# Patient Record
Sex: Female | Born: 1935 | Race: White | Hispanic: No | Marital: Married | Smoking: Never smoker
Health system: Southern US, Community
[De-identification: ages and names within clinical notes are randomized; demographics above are authoritative.]

## PROBLEM LIST (undated history)

## (undated) DIAGNOSIS — C801 Malignant (primary) neoplasm, unspecified: Secondary | ICD-10-CM

## (undated) DIAGNOSIS — I10 Essential (primary) hypertension: Secondary | ICD-10-CM

## (undated) HISTORY — PX: ABDOMINAL HYSTERECTOMY: SHX81

---

## 2019-12-10 ENCOUNTER — Encounter (HOSPITAL_BASED_OUTPATIENT_CLINIC_OR_DEPARTMENT_OTHER): Payer: Self-pay

## 2019-12-10 ENCOUNTER — Other Ambulatory Visit: Payer: Self-pay

## 2019-12-10 ENCOUNTER — Emergency Department (HOSPITAL_BASED_OUTPATIENT_CLINIC_OR_DEPARTMENT_OTHER): Payer: Medicare Other

## 2019-12-10 ENCOUNTER — Emergency Department (HOSPITAL_BASED_OUTPATIENT_CLINIC_OR_DEPARTMENT_OTHER)
Admission: EM | Admit: 2019-12-10 | Discharge: 2019-12-10 | Disposition: A | Discharge status: Home / Self Care | Payer: Medicare Other | Attending: Emergency Medicine | Admitting: Emergency Medicine

## 2019-12-10 DIAGNOSIS — S5011XA Contusion of right forearm, initial encounter: Secondary | ICD-10-CM | POA: Insufficient documentation

## 2019-12-10 DIAGNOSIS — E871 Hypo-osmolality and hyponatremia: Secondary | ICD-10-CM | POA: Diagnosis not present

## 2019-12-10 DIAGNOSIS — C7951 Secondary malignant neoplasm of bone: Secondary | ICD-10-CM

## 2019-12-10 DIAGNOSIS — D72829 Elevated white blood cell count, unspecified: Secondary | ICD-10-CM | POA: Diagnosis not present

## 2019-12-10 DIAGNOSIS — Z79899 Other long term (current) drug therapy: Secondary | ICD-10-CM | POA: Diagnosis not present

## 2019-12-10 DIAGNOSIS — J45909 Unspecified asthma, uncomplicated: Secondary | ICD-10-CM | POA: Insufficient documentation

## 2019-12-10 DIAGNOSIS — S59911A Unspecified injury of right forearm, initial encounter: Secondary | ICD-10-CM | POA: Diagnosis present

## 2019-12-10 DIAGNOSIS — Y929 Unspecified place or not applicable: Secondary | ICD-10-CM | POA: Insufficient documentation

## 2019-12-10 DIAGNOSIS — W19XXXA Unspecified fall, initial encounter: Secondary | ICD-10-CM | POA: Diagnosis not present

## 2019-12-10 DIAGNOSIS — Z7982 Long term (current) use of aspirin: Secondary | ICD-10-CM | POA: Insufficient documentation

## 2019-12-10 DIAGNOSIS — Y939 Activity, unspecified: Secondary | ICD-10-CM | POA: Insufficient documentation

## 2019-12-10 DIAGNOSIS — I10 Essential (primary) hypertension: Secondary | ICD-10-CM | POA: Insufficient documentation

## 2019-12-10 DIAGNOSIS — C801 Malignant (primary) neoplasm, unspecified: Secondary | ICD-10-CM | POA: Diagnosis not present

## 2019-12-10 DIAGNOSIS — Y999 Unspecified external cause status: Secondary | ICD-10-CM | POA: Diagnosis not present

## 2019-12-10 DIAGNOSIS — T07XXXA Unspecified multiple injuries, initial encounter: Secondary | ICD-10-CM

## 2019-12-10 DIAGNOSIS — D649 Anemia, unspecified: Secondary | ICD-10-CM

## 2019-12-10 HISTORY — DX: Malignant (primary) neoplasm, unspecified: C80.1

## 2019-12-10 HISTORY — DX: Essential (primary) hypertension: I10

## 2019-12-10 LAB — CBC WITH DIFFERENTIAL/PLATELET
Abs Immature Granulocytes: 0.08 10*3/uL — ABNORMAL HIGH (ref 0.00–0.07)
Basophils Absolute: 0.1 10*3/uL (ref 0.0–0.1)
Basophils Relative: 0 %
Eosinophils Absolute: 0.2 10*3/uL (ref 0.0–0.5)
Eosinophils Relative: 1 %
HCT: 26.7 % — ABNORMAL LOW (ref 36.0–46.0)
Hemoglobin: 8.6 g/dL — ABNORMAL LOW (ref 12.0–15.0)
Immature Granulocytes: 1 %
Lymphocytes Relative: 7 %
Lymphs Abs: 1 10*3/uL (ref 0.7–4.0)
MCH: 29.1 pg (ref 26.0–34.0)
MCHC: 32.2 g/dL (ref 30.0–36.0)
MCV: 90.2 fL (ref 80.0–100.0)
Monocytes Absolute: 1.2 10*3/uL — ABNORMAL HIGH (ref 0.1–1.0)
Monocytes Relative: 8 %
Neutro Abs: 13.1 10*3/uL — ABNORMAL HIGH (ref 1.7–7.7)
Neutrophils Relative %: 83 %
Platelets: 364 10*3/uL (ref 150–400)
RBC: 2.96 MIL/uL — ABNORMAL LOW (ref 3.87–5.11)
RDW: 14.9 % (ref 11.5–15.5)
WBC: 15.6 10*3/uL — ABNORMAL HIGH (ref 4.0–10.5)
nRBC: 0 % (ref 0.0–0.2)

## 2019-12-10 LAB — COMPREHENSIVE METABOLIC PANEL
ALT: 14 U/L (ref 0–44)
AST: 19 U/L (ref 15–41)
Albumin: 3.3 g/dL — ABNORMAL LOW (ref 3.5–5.0)
Alkaline Phosphatase: 154 U/L — ABNORMAL HIGH (ref 38–126)
Anion gap: 8 (ref 5–15)
BUN: 24 mg/dL — ABNORMAL HIGH (ref 8–23)
CO2: 22 mmol/L (ref 22–32)
Calcium: 9.8 mg/dL (ref 8.9–10.3)
Chloride: 94 mmol/L — ABNORMAL LOW (ref 98–111)
Creatinine, Ser: 0.8 mg/dL (ref 0.44–1.00)
GFR calc Af Amer: >60 mL/min (ref >60)
GFR calc non Af Amer: >60 mL/min (ref >60)
Glucose, Bld: 115 mg/dL — ABNORMAL HIGH (ref 70–99)
Potassium: 4.5 mmol/L (ref 3.5–5.1)
Sodium: 124 mmol/L — ABNORMAL LOW (ref 135–145)
Total Bilirubin: 0.8 mg/dL (ref 0.3–1.2)
Total Protein: 6.9 g/dL (ref 6.5–8.1)

## 2019-12-10 LAB — CBG MONITORING, ED: Glucose-Capillary: 96 mg/dL (ref 70–99)

## 2019-12-10 MED ORDER — SODIUM CHLORIDE 0.9 % IV BOLUS
500.0000 mL | Freq: Once | INTRAVENOUS | Status: AC
  Administered 2019-12-10: 500 mL via INTRAVENOUS

## 2019-12-10 NOTE — ED Notes (Signed)
[facility] contacted for ride home, pt's daughter states she cannot get her into her home.

## 2019-12-10 NOTE — ED Notes (Signed)
Daughter called stated she's in the parking lot if needed to call her @ [phone]

## 2019-12-10 NOTE — ED Triage Notes (Signed)
Pt arrived via [facility]. They report a home health nurse wanted pt taken to the hospital to have her R arm evaluated for swelling and bruising. Pt has a large hematoma to R mid-forearm with bruising that extends to finger. Pt has a hx of a fall recently, but pt is a poor historian as to when it happened. Pt also noted to have multiple other areas of bruising. Pt is A/Ox4 in triage.

## 2019-12-10 NOTE — ED Notes (Signed)
Patient transported to X-ray 

## 2019-12-10 NOTE — ED Notes (Signed)
Pt transferred home with [facility]. Discharge paperwork sent with patient.

## 2019-12-10 NOTE — ED Notes (Addendum)
Return address verified with daughter. Both RN and EDP spoke with daughter regarding home instructions, paperwork to be sent with [facility].

## 2019-12-10 NOTE — ED Provider Notes (Signed)
[facility] EMERGENCY DEPARTMENT Provider Note   CSN: [id number] Arrival date & time: 12/10/19  1639     History Chief Complaint  Patient presents with  . Arm Injury    Brandi Best is a 86 y.o. female.  Pt presents to the ED today with frequent falls.  Pt is a poor historian and is unable to tell me what happened.  Pt's home health nurse called EMS.  Pt has bruises everywhere and a deformity to the right forearm.  Pt denies any pain.  Pt was admitted from 12/18-12/26 at [facility].  She had pulmonary edema, bladder cancer, and low sodium.  She had leukocytosis and anemia.  SNF was recommended, but family wanted to take her home with home health.           Past Medical History:  Diagnosis Date  . Cancer ([place])    bladder (resolved)  . Hypertension     Past Medical History:  Diagnosis Date  . Acute cystitis with hematuria 09/29/2017  . Asthma  no problems in years  . Basal cell carcinoma, face  nose, forehead  . Bladder carcinoma ([place]) 02/17/2019  . Bladder tumor 01/21/2019  . History of kidney stones  states she passed the stones  . Hypertension with goal to be determined  . Kidney stone  . Osteoporosis  . Overactive thyroid gland  . Post-menopausal atrophic vaginitis 11/08/2018   Patient Active Problem List  Diagnosis Date Noted  . Leukocytosis 11/26/2019  . Debility 11/26/2019  . Hyponatremia 11/26/2019  . SIADH (syndrome of inappropriate ADH production) ([place]) 11/26/2019  . Oropharyngeal dysphagia 11/26/2019  . Malignant neoplasm of bladder ([place]) 11/20/2019  . History of bladder cancer 10/21/2019  . Anemia 10/21/2019  . Dysphonia 08/19/2019  . Bladder carcinoma ([place]) 02/17/2019  . Post-menopausal atrophic vaginitis 11/08/2018  . Acute cystitis with hematuria 09/29/2017  . Hematuria 09/24/2017  . Urethral caruncle 09/24/2017  . Raynaud's phenomenon without gangrene 06/12/2017  . Hyperthyroidism 01/27/2017  . Osteoporosis 12/22/2015  .  Benign essential hypertension 12/22/2015  . Dysuria 12/22/2015  . Hyperlipidemia LDL goal <100 12/22/2015  . Nontoxic multinodular goiter 12/22/2015  . Pain, joint, multiple sites 12/22/2015  . Impaired fasting glucose 12/22/2015   There are no problems to display for this patient.   Past Surgical History:  Procedure Laterality Date  . ABDOMINAL HYSTERECTOMY       OB History   No obstetric history on file.     No family history on file.  Social History   Tobacco Use  . Smoking status: Never Smoker  . Smokeless tobacco: Never Used  Substance Use Topics  . Alcohol use: Never  . Drug use: Never    Home Medications Prior to Admission medications   Medication Sig Start Date End Date Taking? Authorizing Provider  metoprolol tartrate (LOPRESSOR) 100 MG tablet Take 100 mg by mouth once. 07/02/15 12/26/19 Yes [provider]  amLODipine (NORVASC) 10 MG tablet Take 10 mg by mouth daily. 11/26/19   [provider]  START taking these medications  Details  amLODIPine (NORVASC) 10 MG tablet Take 1 tablet (10 mg total) by mouth daily for 30 days. Qty: 30 tablet, Refills: 0   azithromycin (ZITHROMAX) 500 MG tablet Take 1 tablet (500 mg total) by mouth daily for 5 days. Qty: 5 tablet, Refills: 0  Associated Diagnoses: Cardiogenic pulmonary edema ([place]); Leukocytosis, unspecified type   dextromethorphan-guaifenesin (ROBITUSSIN-DM) 10-100 mg/5 mL syrup Take 5 mLs by mouth every 6 (six) hours  as needed for up to 10 days. Qty: 118 mL, Refills: 0    CONTINUE these medications which have CHANGED  Details  !! metoPROLOL tartrate (LOPRESSOR) 100 MG tablet Take 1 tablet (100 mg total) by mouth 2 times daily for 30 days. Qty: 60 tablet, Refills: 0   !! - Potential duplicate medications found. Please discuss with provider.   CONTINUE these medications which have NOT CHANGED  Details  acetaminophen (TYLENOL) 325 MG tablet Take 325 mg by mouth every 6 (six) hours as needed  for Pain.   ascorbic acid, vitamin C, (VITAMIN C) 500 MG tablet Take 1 tablet (500 mg total) by mouth daily. Qty:  Associated Diagnoses: Anemia, unspecified type   aspirin 81 MG chewable tablet *ANTIPLATELET* Take 81 mg by mouth daily. On hold pre-op   calcium-vitamin D (CALCIUM-VITAMIN D) 500 mg(1,267m) -200 unit per tablet Take 1 tablet by mouth daily. Use as directed   estradiol (ESTRACE) 0.01 % (0.1 mg/gram) vaginal cream apply 2 times/week intravaginally Qty: 42.5 g, Refills: 1   ferrous sulfate 325 (65 FE) MG EC tablet Take 1 tablet (325 mg total) by mouth daily with breakfast.  Associated Diagnoses: Anemia, unspecified type   losartan (COZAAR) 100 MG tablet TAKE 1 TABLET DAILY Qty: 90 tablet, Refills: 4   !! metoPROLOL tartrate (LOPRESSOR) 100 MG tablet Take 50 mg by mouth every morning.   multivitamin (THERAGRAN) tablet Take 1 tablet by mouth daily.   oxybutynin XL (DITROPAN XL) 5 MG extended release tablet Take 1 tablet (5 mg total) by mouth daily. Qty: 90 tablet, Refills: 3   sertraline (ZOLOFT) 25 MG tablet Take 1 tablet (25 mg total) by mouth daily. Qty: 30 tablet, Refills: 5  Associated Diagnoses: Current mild episode of major depressive disorder without prior episode ([place]   !! - Potential duplicate medications found. Please discuss with provider.   STOP taking these medications   ciprofloxacin HCl (CIPRO) 250 MG tablet   tiZANidine (ZANAFLEX) 4 MG tablet     Allergies    Sulfa antibiotics, Cardizem  [diltiazem], Lisinopril, Morphine and related, Other, Sulfamethoxazole-trimethoprim, Ciprofloxacin, and Nitrofurantoin  Review of Systems   Review of Systems  All other systems reviewed and are negative.   Physical Exam Updated Vital Signs BP (!) 142/53 (BP Location: Left Arm)   Pulse 82   Temp 98 F (36.7 C) (Oral)   Resp (!) 22   Ht _0  (1.575 m)   Wt 43.5 kg   SpO2 98%   BMI 17.56 kg/m   Physical Exam Vitals and nursing note reviewed.    Constitutional:      Appearance: Normal appearance.  HENT:     Head: Normocephalic and atraumatic.     Right Ear: External ear normal.     Left Ear: External ear normal.     Nose: Nose normal.     Mouth/Throat:     Mouth: Mucous membranes are moist.     Pharynx: Oropharynx is clear.  Eyes:     Extraocular Movements: Extraocular movements intact.     Conjunctiva/sclera: Conjunctivae normal.     Pupils: Pupils are equal, round, and reactive to light.  Cardiovascular:     Rate and Rhythm: Normal rate and regular rhythm.     Pulses: Normal pulses.     Heart sounds: Normal heart sounds.  Pulmonary:     Effort: Pulmonary effort is normal.     Breath sounds: Normal breath sounds.  Abdominal:     General: Abdomen is flat.  Bowel sounds are normal.     Palpations: Abdomen is soft.  Musculoskeletal:     Right forearm: Deformity present.     Cervical back: Normal range of motion and neck supple.     Right foot: Deformity and tenderness present.  Skin:    General: Skin is warm.     Capillary Refill: Capillary refill takes less than 2 seconds.  Neurological:     General: No focal deficit present.     Mental Status: She is alert and oriented to person, place, and time.  Psychiatric:        Mood and Affect: Mood normal.        Behavior: Behavior normal.        Thought Content: Thought content normal.        Judgment: Judgment normal.     ED Results / Procedures / Treatments   Labs (all labs ordered are listed, but only abnormal results are displayed) Labs Reviewed  CBC WITH DIFFERENTIAL/PLATELET - Abnormal; Notable for the following components:      Result Value   WBC 15.6 (*)    RBC 2.96 (*)    Hemoglobin 8.6 (*)    HCT 26.7 (*)    Neutro Abs 13.1 (*)    Monocytes Absolute 1.2 (*)    Abs Immature Granulocytes 0.08 (*)    All other components within normal limits  COMPREHENSIVE METABOLIC PANEL - Abnormal; Notable for the following components:   Sodium 124 (*)    Chloride  94 (*)    Glucose, Bld 115 (*)    BUN 24 (*)    Albumin 3.3 (*)    Alkaline Phosphatase 154 (*)    All other components within normal limits  URINALYSIS, ROUTINE W REFLEX MICROSCOPIC  CBG MONITORING, ED    EKG EKG Interpretation  Date/Time:  Saturday December 10 2019 17:24:12 EST Ventricular Rate:  79 PR Interval:    QRS Duration: 89 QT Interval:  364 QTC Calculation: 418 R Axis:   47 Text Interpretation: Sinus rhythm LVH with secondary repolarization abnormality Baseline wander in lead(s) V3 No old tracing to compare Confirmed by Isla Pence [phone]) on 12/10/2019 5:36:20 PM   Radiology DG Chest 2 View  Result Date: 12/10/2019 CLINICAL DATA:  Pain status post fall EXAM: CHEST - 2 VIEW COMPARISON:  November 24, 2019 FINDINGS: There is a moderate-sized right-sided pleural effusion. There is a small left-sided pleural effusion. Aortic calcifications are noted. The heart size is mildly enlarged. There is no pneumothorax. Bibasilar atelectasis is noted. There is no obvious acute osseous abnormality. IMPRESSION: 1. Small to moderate-sized bilateral pleural effusions, right greater than left. 2. Bibasilar airspace opacities favored to represent atelectasis. 3. Aortic atherosclerosis.  Mildly enlarged cardiac silhouette. Electronically Signed   By: Constance Holster M.D.   On: 12/10/2019 18:23   DG Pelvis 1-2 Views  Result Date: 12/10/2019 CLINICAL DATA:  Pain status post fall EXAM: PELVIS - 1-2 VIEW COMPARISON:  None. FINDINGS: There is no evidence of pelvic fracture or diastasis. No pelvic bone lesions are seen. IMPRESSION: Negative. Electronically Signed   By: Constance Holster M.D.   On: 12/10/2019 18:27   DG Forearm Right  Result Date: 12/10/2019 CLINICAL DATA:  Pain status post fall EXAM: RIGHT FOREARM - 2 VIEW COMPARISON:  None. FINDINGS: There is soft tissue swelling about the radial aspect of the mid forearm. There is no underlying acute displaced fracture. There is no dislocation.  IMPRESSION: Soft tissue swelling about the radial aspect of  the mid forearm without underlying acute displaced fracture or dislocation. Electronically Signed   By: Constance Holster M.D.   On: 12/10/2019 18:25   DG Ankle Complete Right  Result Date: 12/10/2019 CLINICAL DATA:  Pain status post fall EXAM: RIGHT ANKLE - COMPLETE 3+ VIEW COMPARISON:  None. FINDINGS: There is no evidence of fracture, dislocation, or joint effusion. There is no evidence of arthropathy or other focal bone abnormality. Soft tissues are unremarkable. IMPRESSION: Negative. Electronically Signed   By: Constance Holster M.D.   On: 12/10/2019 18:26   CT Head Wo Contrast  Result Date: 12/10/2019 CLINICAL DATA:  Mental status changes EXAM: CT HEAD WITHOUT CONTRAST TECHNIQUE: Contiguous axial images were obtained from the base of the skull through the vertex without intravenous contrast. COMPARISON:  08/28/2011 FINDINGS: Brain: There is atrophy and chronic small vessel disease changes. No acute intracranial abnormality. Specifically, no hemorrhage, hydrocephalus, mass lesion, acute infarction, or significant intracranial injury. Vascular: No hyperdense vessel or unexpected calcification. Skull: No acute calvarial abnormality. Sinuses/Orbits: Visualized paranasal sinuses and mastoids clear. Orbital soft tissues unremarkable. Other: None IMPRESSION: Atrophy, chronic microvascular disease. No acute intracranial abnormality. Electronically Signed   By: Rolm Baptise M.D.   On: 12/10/2019 18:23   CT Cervical Spine Wo Contrast  Result Date: 12/10/2019 CLINICAL DATA:  Recent fall.  Right arm bruising.  Neck trauma. EXAM: CT CERVICAL SPINE WITHOUT CONTRAST TECHNIQUE: Multidetector CT imaging of the cervical spine was performed without intravenous contrast. Multiplanar CT image reconstructions were also generated. COMPARISON:  Plain films 11/16/2019 FINDINGS: Alignment: No subluxation Skull base and vertebrae: There is a destructive lytic lesion  within the left side of the T1 vertebral body. Concern for associated mild pathologic compression fracture. Soft tissues and spinal canal: Abnormal soft tissue mass extends from the T1 vertebral body into the left neural foramina and into the spinal canal with compression on the left anterior thecal sac. Disc levels:  Disc space narrowing in the lower cervical spine. Upper chest: Biapical scarring. Other: No acute findings.  Carotid artery calcifications. IMPRESSION: Lytic expansile lesion in the left side of the T1 vertebral body with associated soft tissue extending into the spinal canal and left neural foramina. This is concerning for osseous metastasis. Recommend clinical correlation for cancer history. Concern for pathologic mild compression fracture through the T1 vertebral body. These results were called by telephone at the time of interpretation on 12/10/2019 at 6:31 pm to provider [facility] , who verbally acknowledged these results. Electronically Signed   By: Rolm Baptise M.D.   On: 12/10/2019 18:31   DG Hand Complete Left  Result Date: 12/10/2019 CLINICAL DATA:  Pain status post fall EXAM: LEFT HAND - COMPLETE 3+ VIEW COMPARISON:  None. FINDINGS: There is no evidence of fracture or dislocation. There is no evidence of arthropathy or other focal bone abnormality. Soft tissues are unremarkable. There is nonspecific flexion at the D IP of the second digit. IMPRESSION: Negative. Electronically Signed   By: Constance Holster M.D.   On: 12/10/2019 18:27   DG Foot Complete Right  Result Date: 12/10/2019 CLINICAL DATA:  Pain status post fall EXAM: RIGHT FOOT COMPLETE - 3+ VIEW COMPARISON:  None. FINDINGS: There is no evidence of fracture or dislocation. There is no evidence of arthropathy or other focal bone abnormality. Soft tissues are unremarkable. IMPRESSION: Negative. Electronically Signed   By: Constance Holster M.D.   On: 12/10/2019 18:22    Procedures Procedures (including critical care  time)  Medications Ordered in  ED Medications  sodium chloride 0.9 % bolus 500 mL (500 mLs Intravenous New Bag/Given 12/10/19 1746)    ED Course  I have reviewed the triage vital signs and the nursing notes.  Pertinent labs & imaging results that were available during my care of the patient were reviewed by me and considered in my medical decision making (see chart for details).    MDM Rules/Calculators/A&P                      Most recent labs: WBC 19.6 (H) 11/26/2019  WBC 19.7 (H) 11/26/2019  HGB 10.0 (L) 11/26/2019  HCT 29.7 (L) 11/26/2019  PLT 358 11/26/2019  CHOL 124 11/24/2019  TRIG 73 11/24/2019  HDL 42 11/24/2019  ALT 9 11/18/2019  AST 17 11/18/2019  NA 129 (L) 11/26/2019  K 4.4 11/26/2019  CL 95 (L) 11/26/2019  CREATININE 0.80 11/26/2019  BUN 30 (H) 11/26/2019  CO2 28 11/26/2019  TSH 1.550 11/21/2019  LABPROT 13.5 09/28/2017  PTT 31.5 09/28/2017  INR 1.03 09/28/2017   From PCP:  Discussed results of labs from 12/31-WBC 17.4 and Na+ 130.   Pt has a hx of hyponatremia.  She did receive Tolvaptan which did not help much, so her low sodium was treated with volume restriction as it is thought she has SIADH vs an underlying malignancy.  Pt d/w her daughter Si Raider.  She is her healthcare POA.  She said they have everything for pt at home. They have home health, OT/PT.  She has spoken with pt's cancer doctor who said pt is too weak to undergo any kind of bone marrow eval/treatment.  Pt's daughter told of the met to T1.  The daughter is in agreement for comfort care only.  She seems very reasonable.  Pt is not in pain.  She is stable for d/c home.  CRITICAL CARE Performed by: Isla Pence   Total critical care time: 30 minutes  Critical care time was exclusive of separately billable procedures and treating other patients.  Critical care was necessary to treat or prevent imminent or life-threatening deterioration.  Critical care was time spent personally  by me on the following activities: development of treatment plan with patient and/or surrogate as well as nursing, discussions with consultants, evaluation of patient's response to treatment, examination of patient, obtaining history from patient or surrogate, ordering and performing treatments and interventions, ordering and review of laboratory studies, ordering and review of radiographic studies, pulse oximetry and re-evaluation of patient's condition.    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

## 2020-01-30 DEATH — deceased

## 2021-01-30 IMAGING — DX DG CHEST 2V
2 series · 2 of 2 positions shown · non-contrast
Comparison: November 24, 2019

CLINICAL DATA: Pain status post fall

EXAM:
CHEST - 2 VIEW

[chest lat]
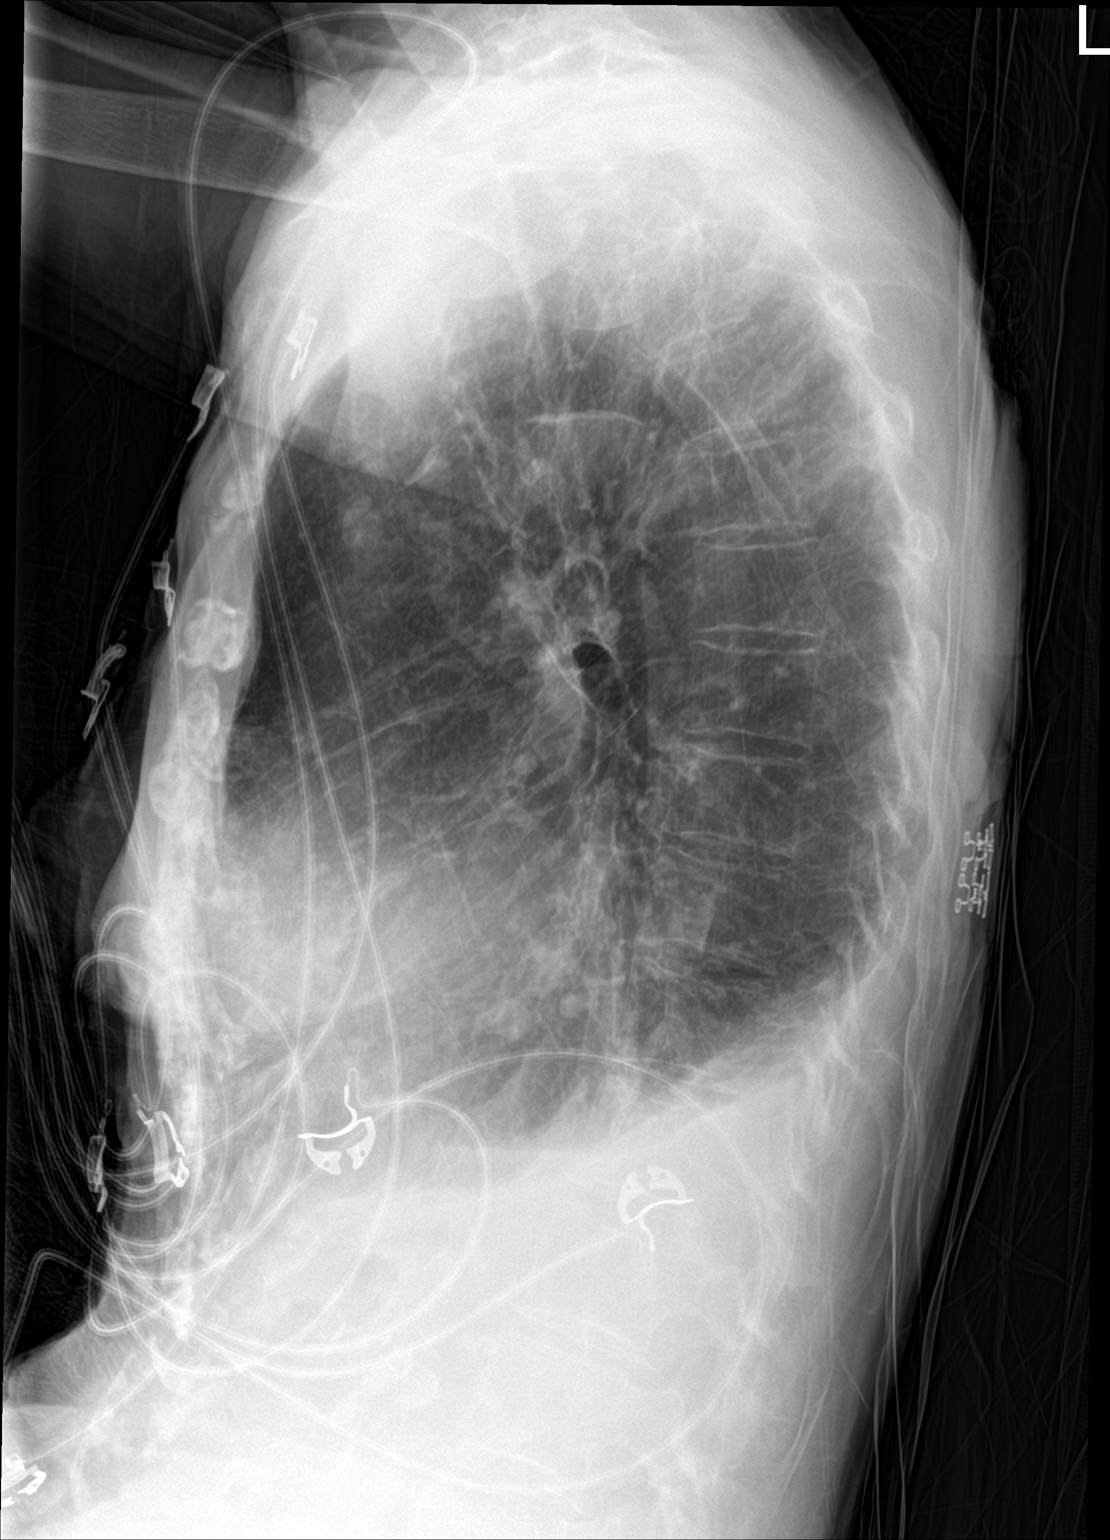

[chest ap strecther]
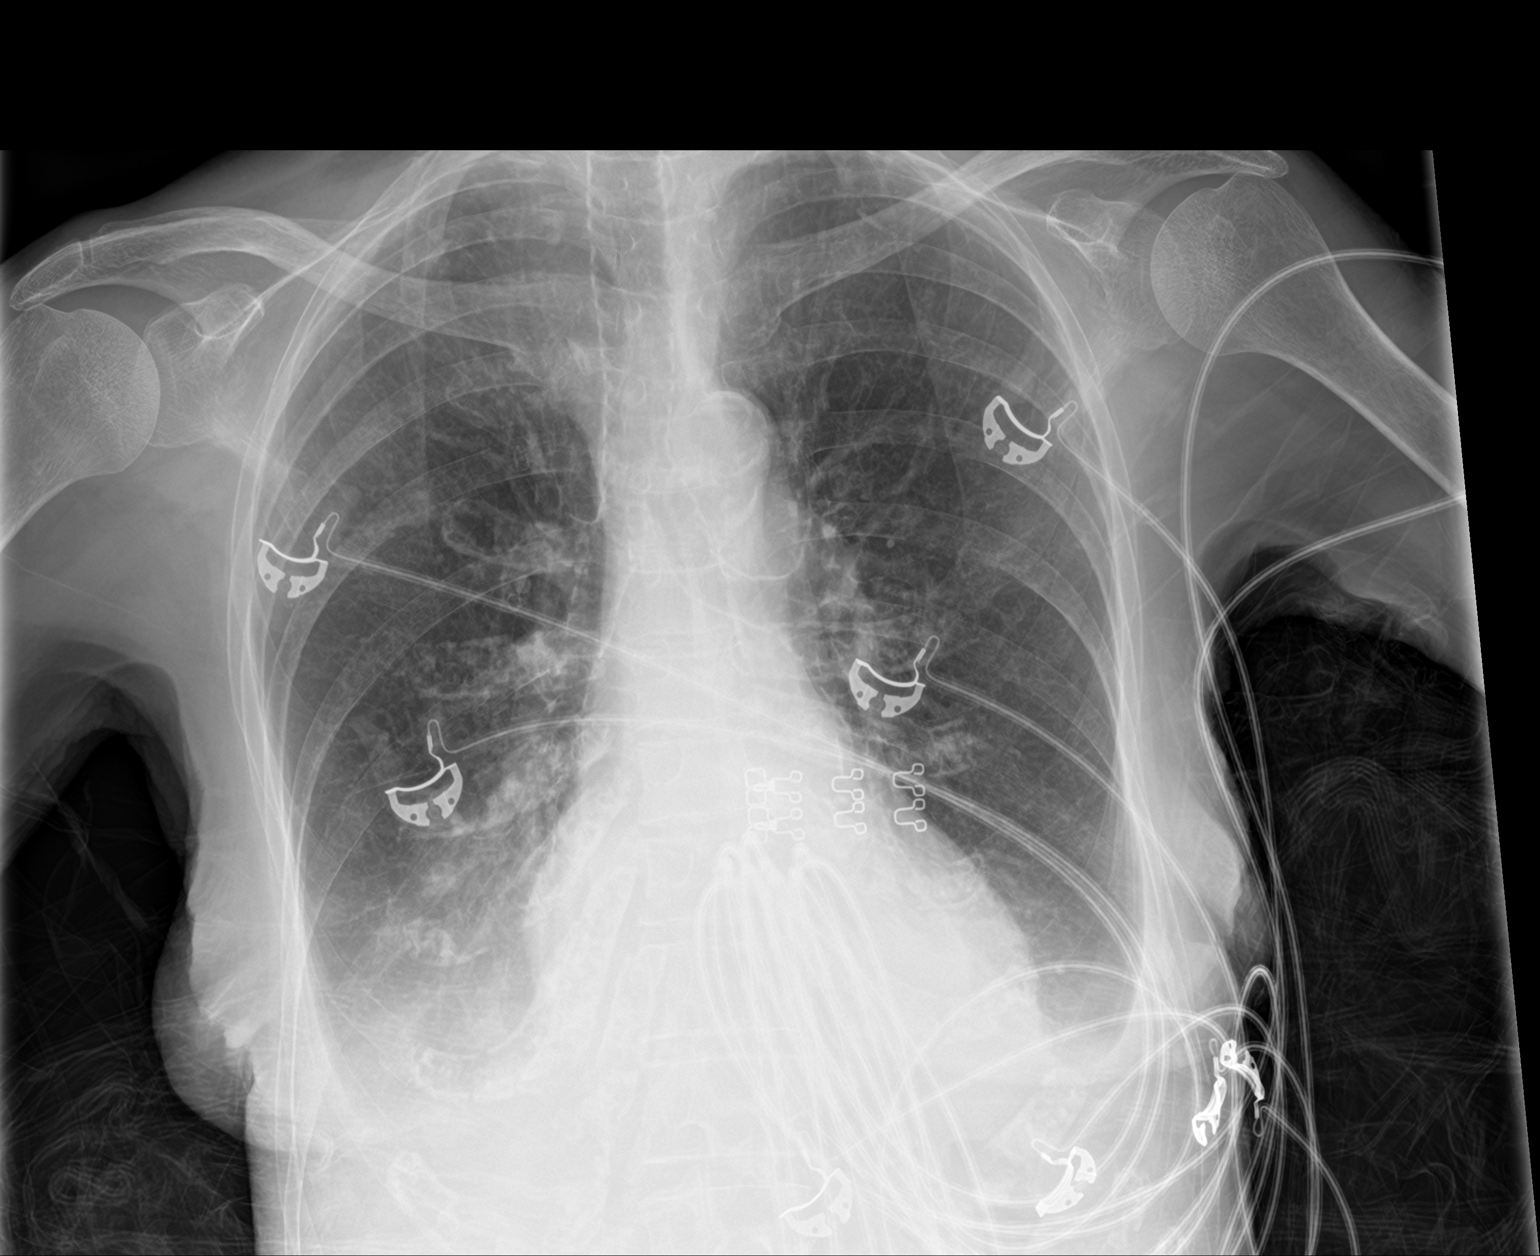

[2 of 2 positions shown; findings below may reference images not displayed]

FINDINGS: There is a moderate-sized right-sided pleural effusion. There is a
small left-sided pleural effusion. Aortic calcifications are noted.
The heart size is mildly enlarged. There is no pneumothorax.
Bibasilar atelectasis is noted. There is no obvious acute osseous
abnormality.
IMPRESSION: 1. Small to moderate-sized bilateral pleural effusions, right
greater than left.
2. Bibasilar airspace opacities favored to represent atelectasis.
3. Aortic atherosclerosis.  Mildly enlarged cardiac silhouette.

## 2021-01-30 IMAGING — DX DG HAND COMPLETE 3+V*L*
3 series · 3 of 3 positions shown · non-contrast
Comparison: None.

CLINICAL DATA: Pain status post fall

EXAM:
LEFT HAND - COMPLETE 3+ VIEW

[hand pa]
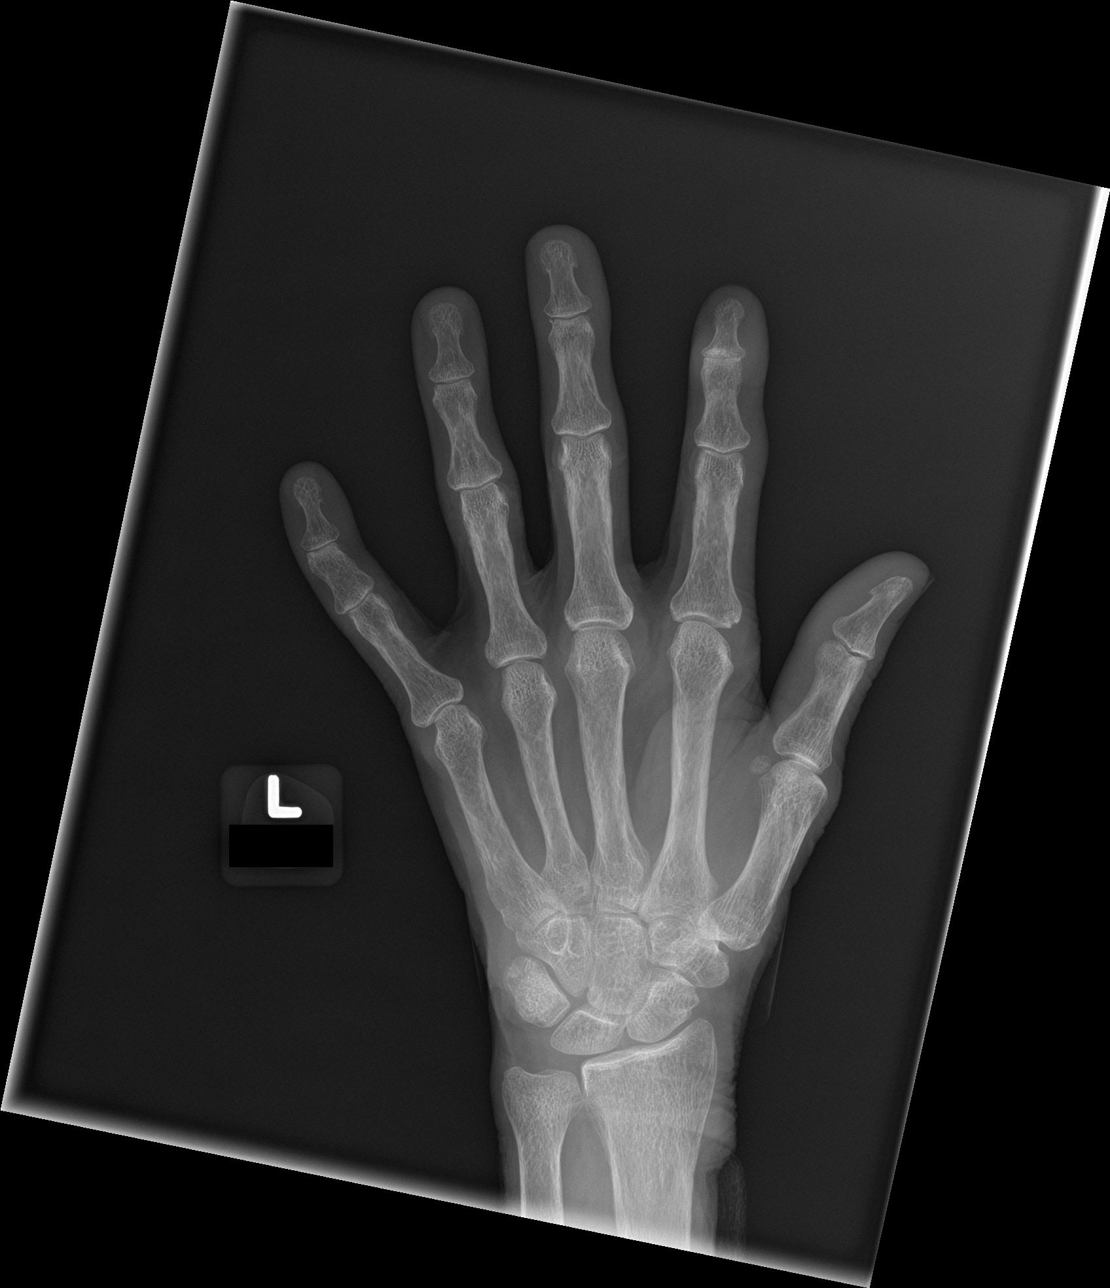

[hand obl]
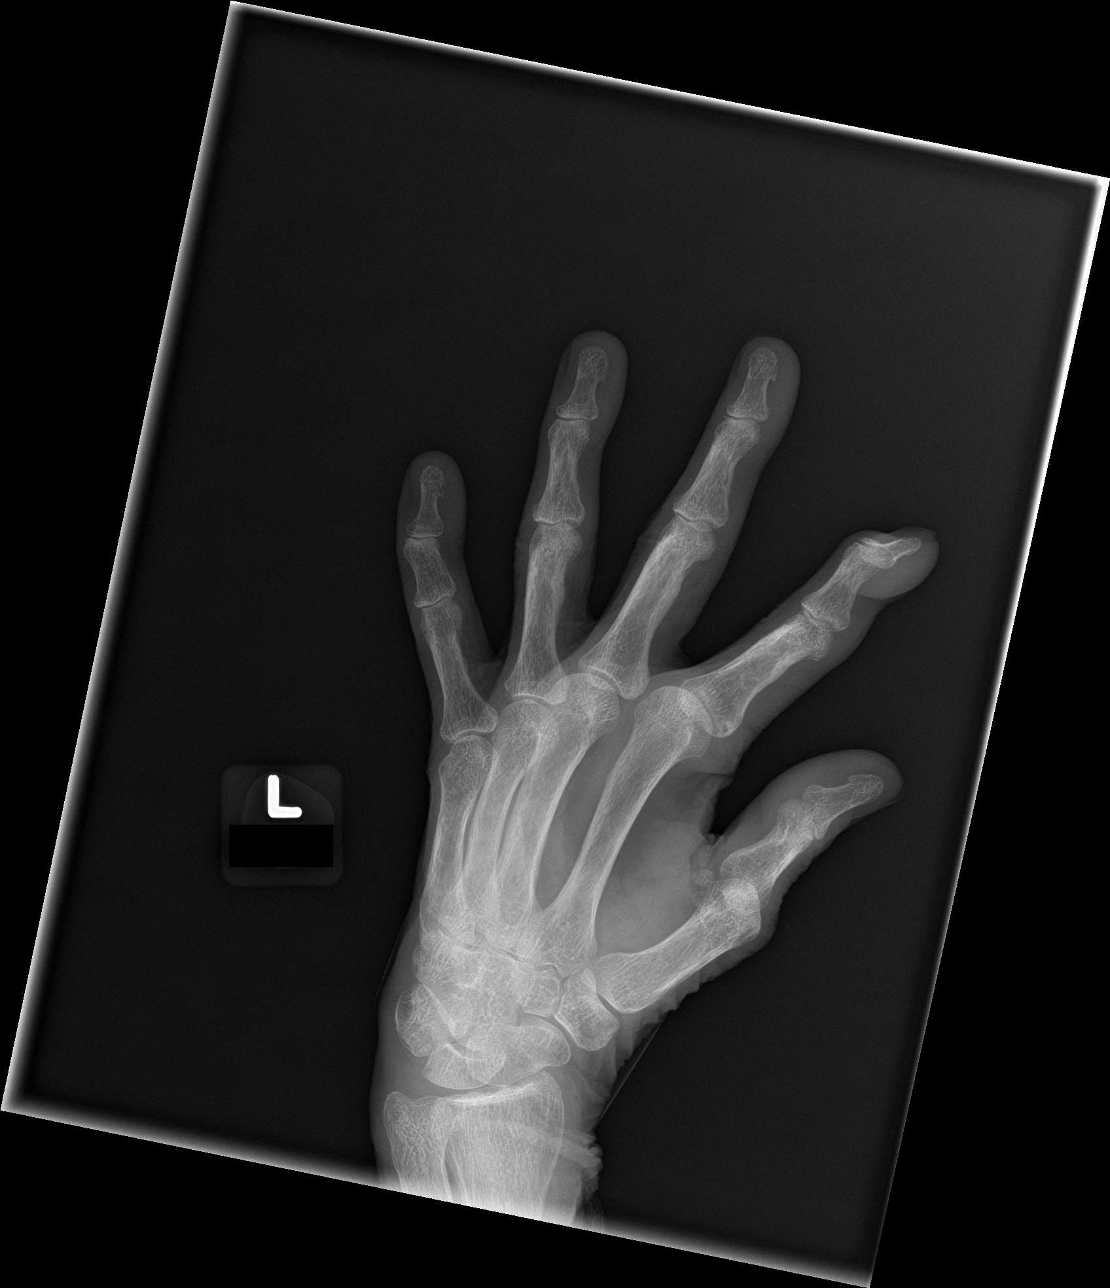

[hand lat]
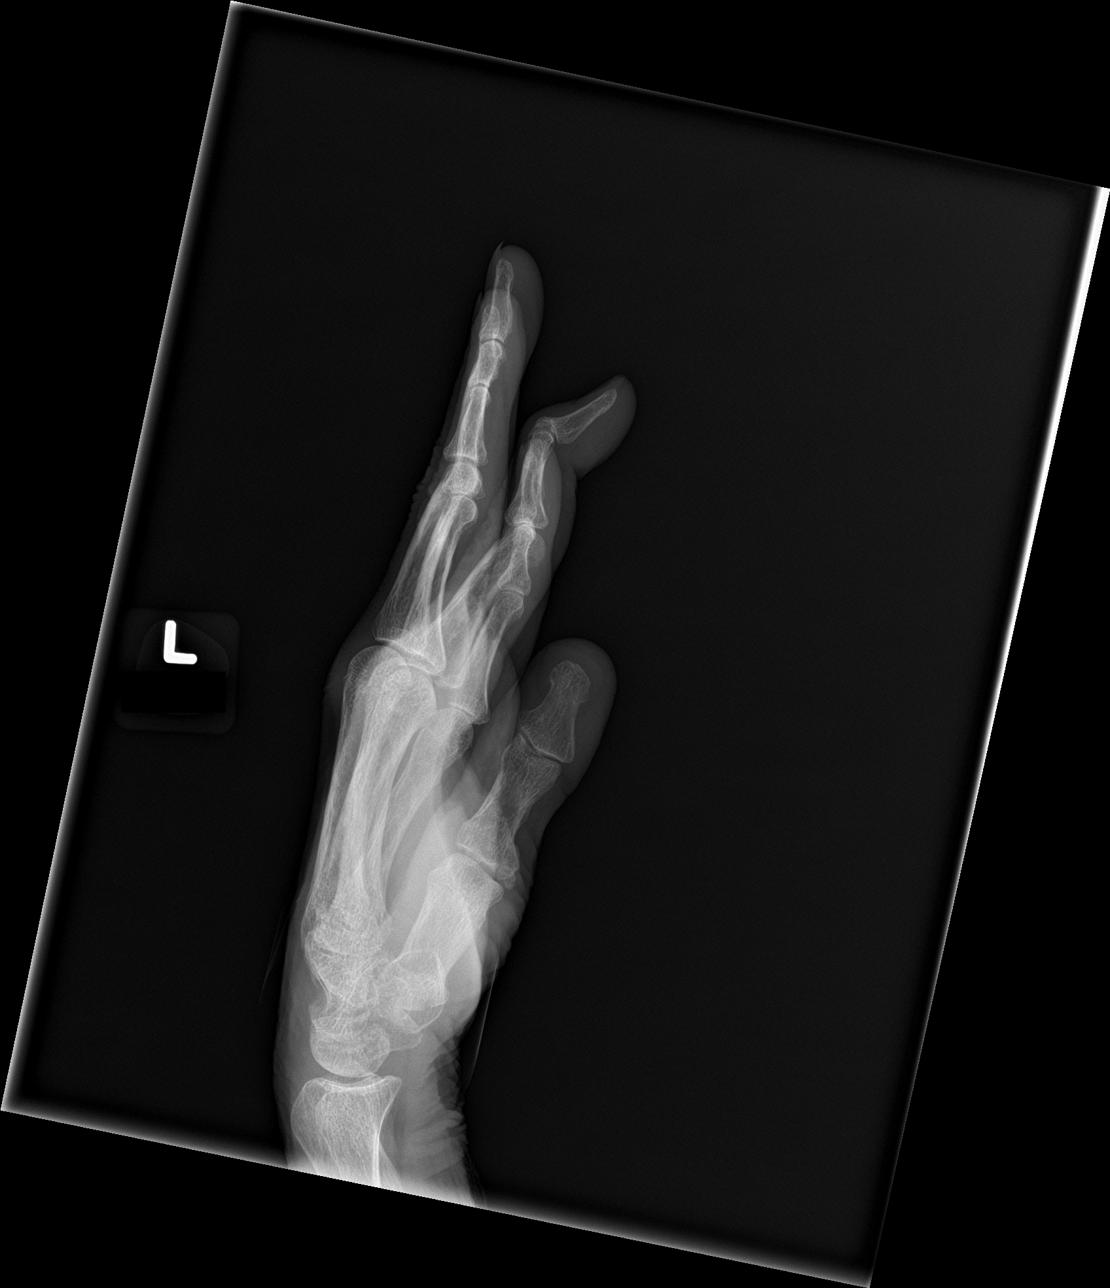

[3 of 3 positions shown; findings below may reference images not displayed]

FINDINGS: There is no evidence of fracture or dislocation. There is no
evidence of arthropathy or other focal bone abnormality. Soft
tissues are unremarkable. There is nonspecific flexion at the D IP
of the second digit.
IMPRESSION: Negative.

## 2021-01-30 IMAGING — DX DG PELVIS 1-2V
1 series · 1 of 1 positions shown · non-contrast
Comparison: None.

CLINICAL DATA: Pain status post fall

EXAM:
PELVIS - 1-2 VIEW

[pelvis ap]
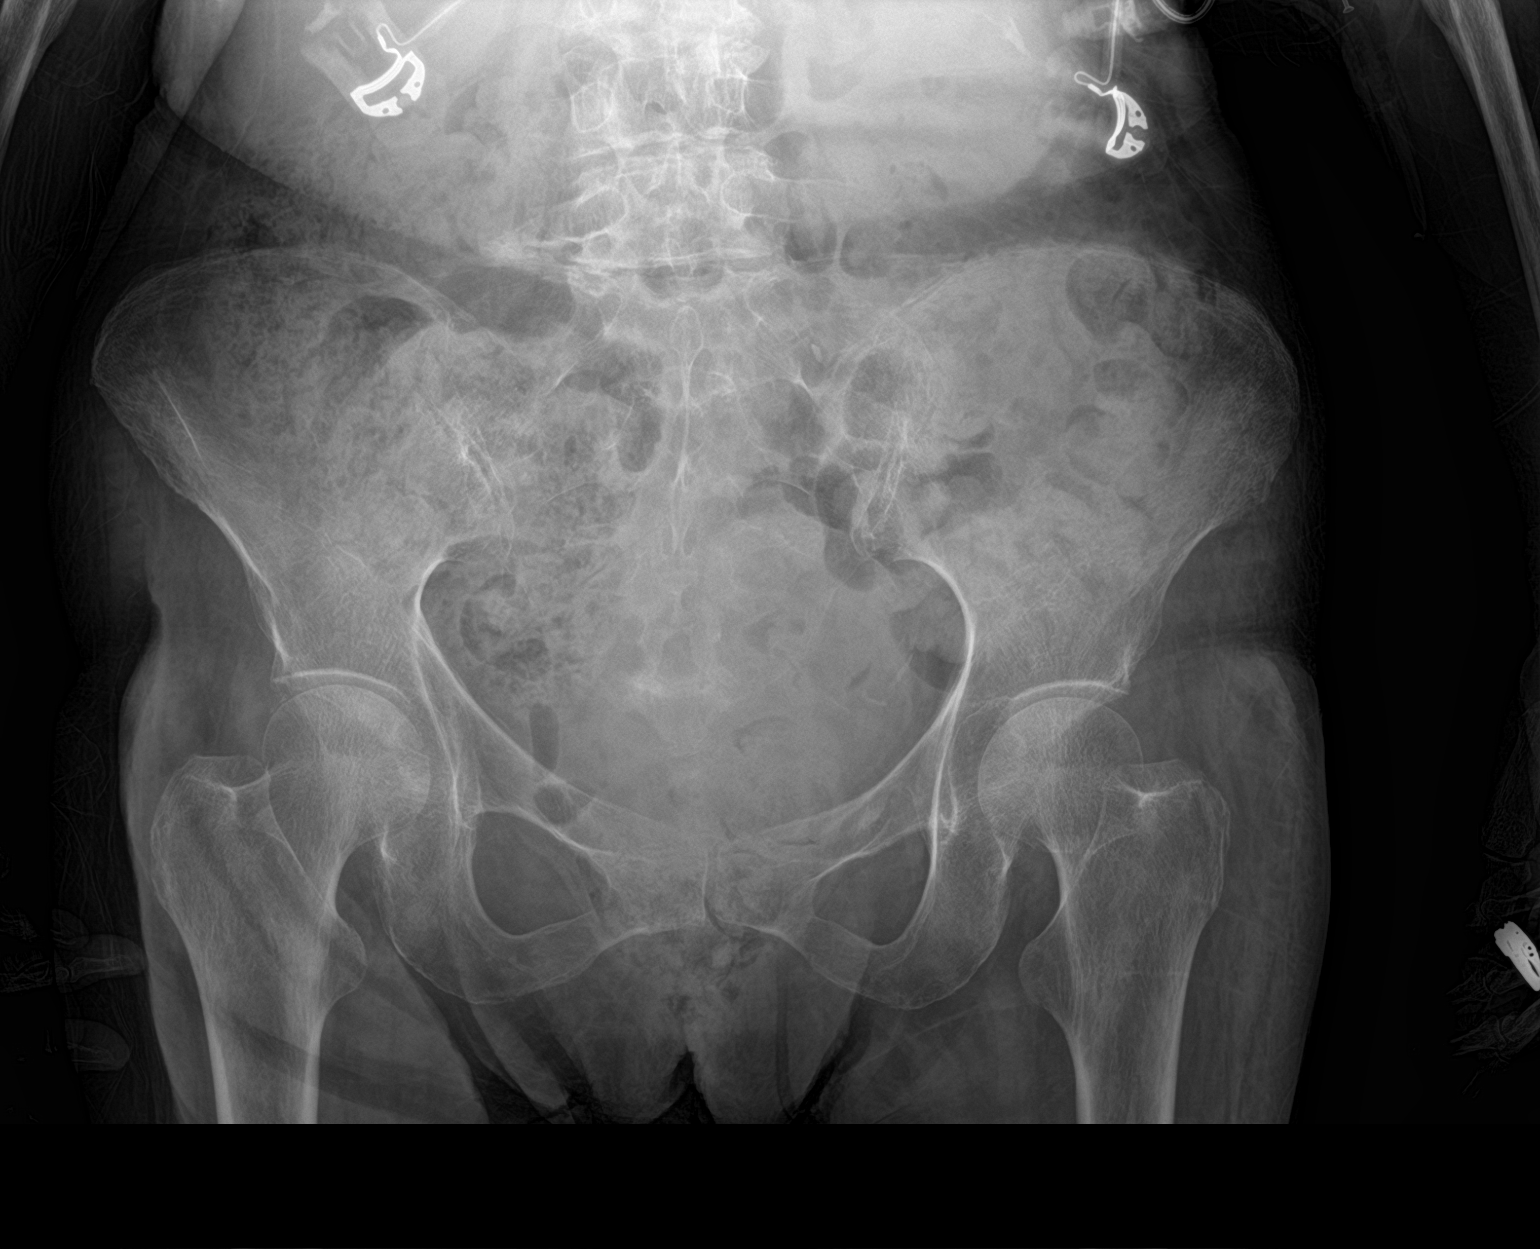

[1 of 1 positions shown; findings below may reference images not displayed]

FINDINGS: There is no evidence of pelvic fracture or diastasis. No pelvic bone
lesions are seen.
IMPRESSION: Negative.

## 2021-01-30 IMAGING — DX DG FOOT COMPLETE 3+V*R*
3 series · 3 of 3 positions shown · non-contrast
Comparison: None.

CLINICAL DATA: Pain status post fall

EXAM:
RIGHT FOOT COMPLETE - 3+ VIEW

[foot ap]
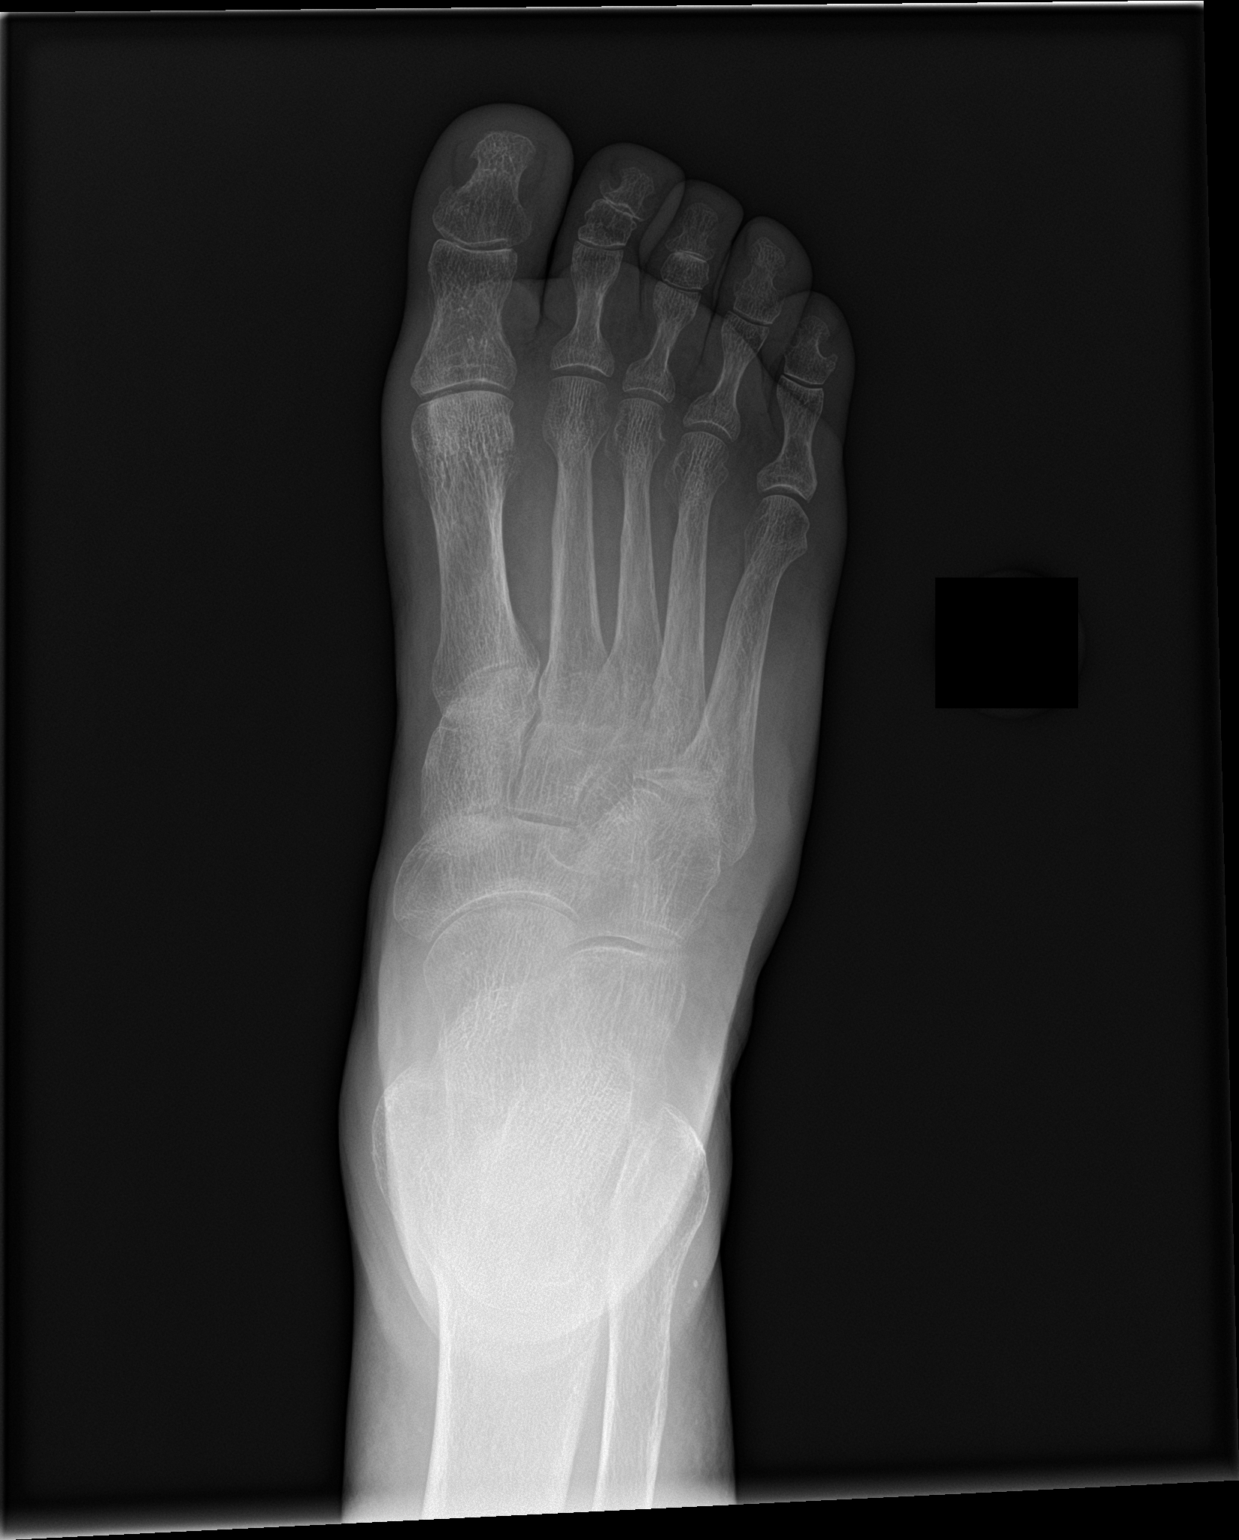

[foot obl]
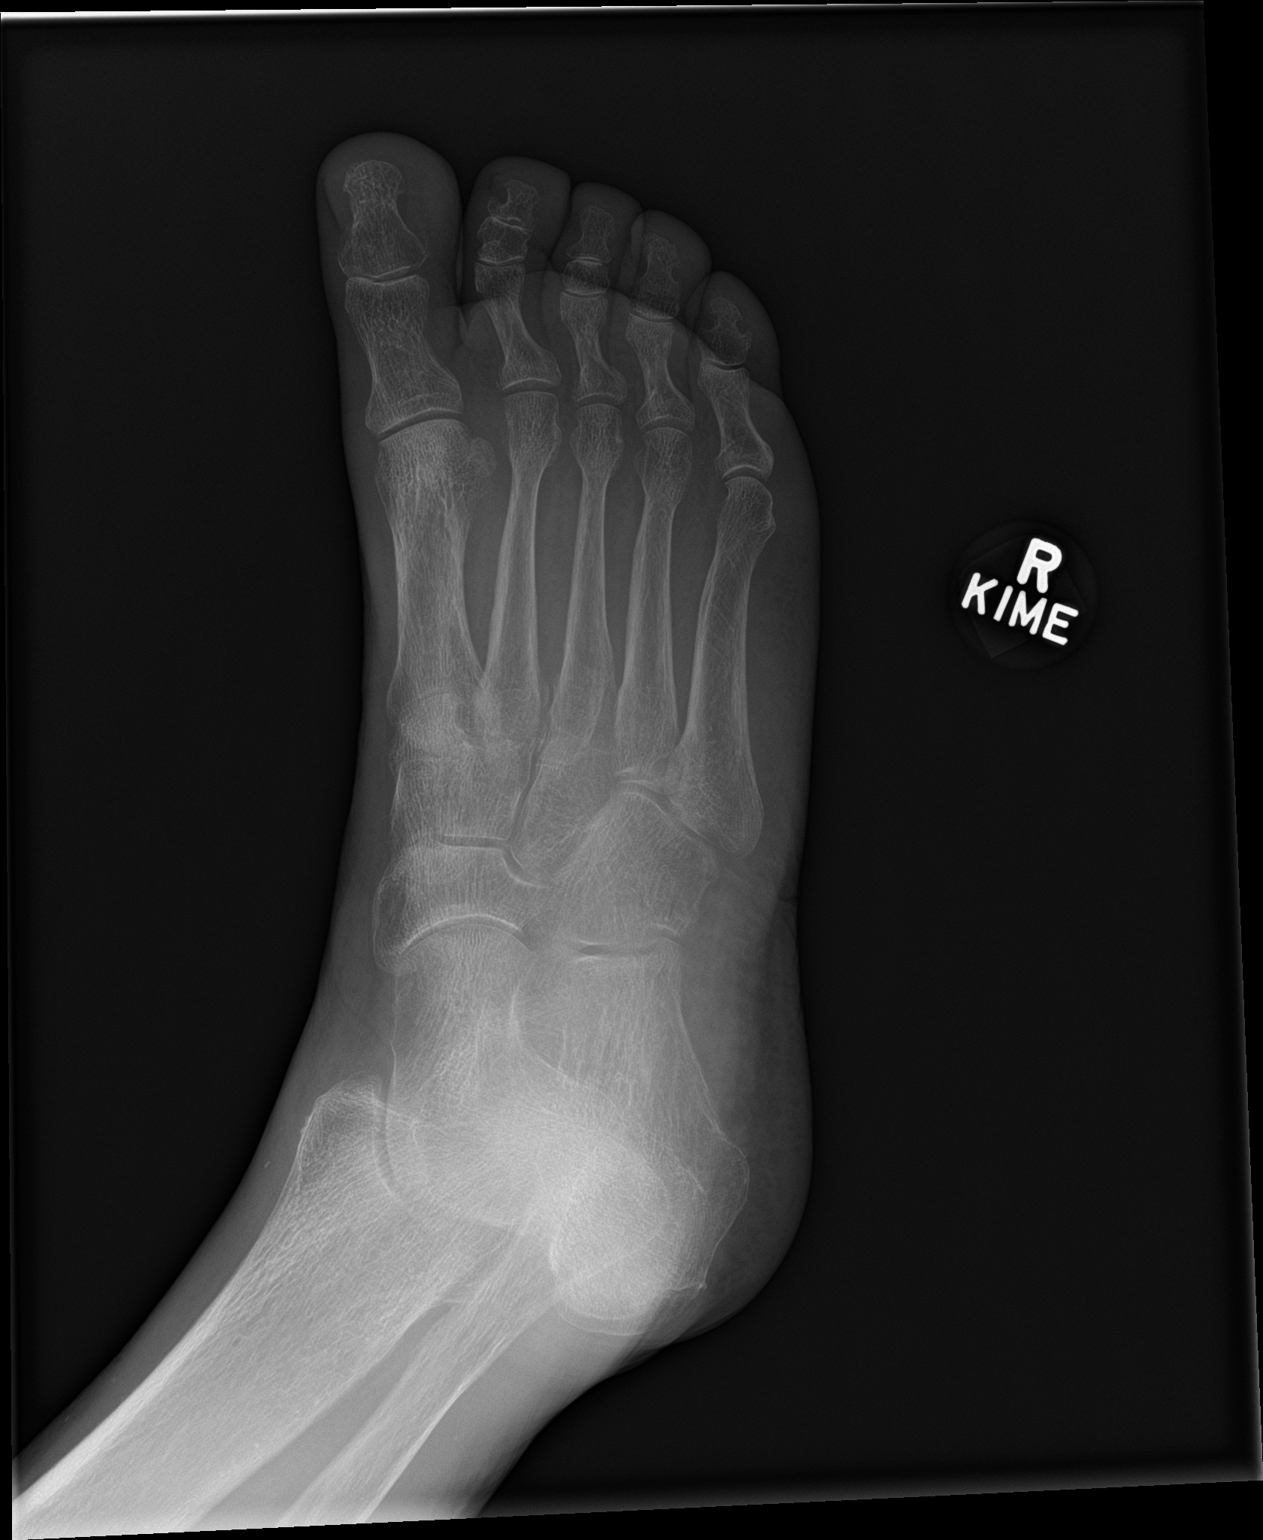

[foot lat]
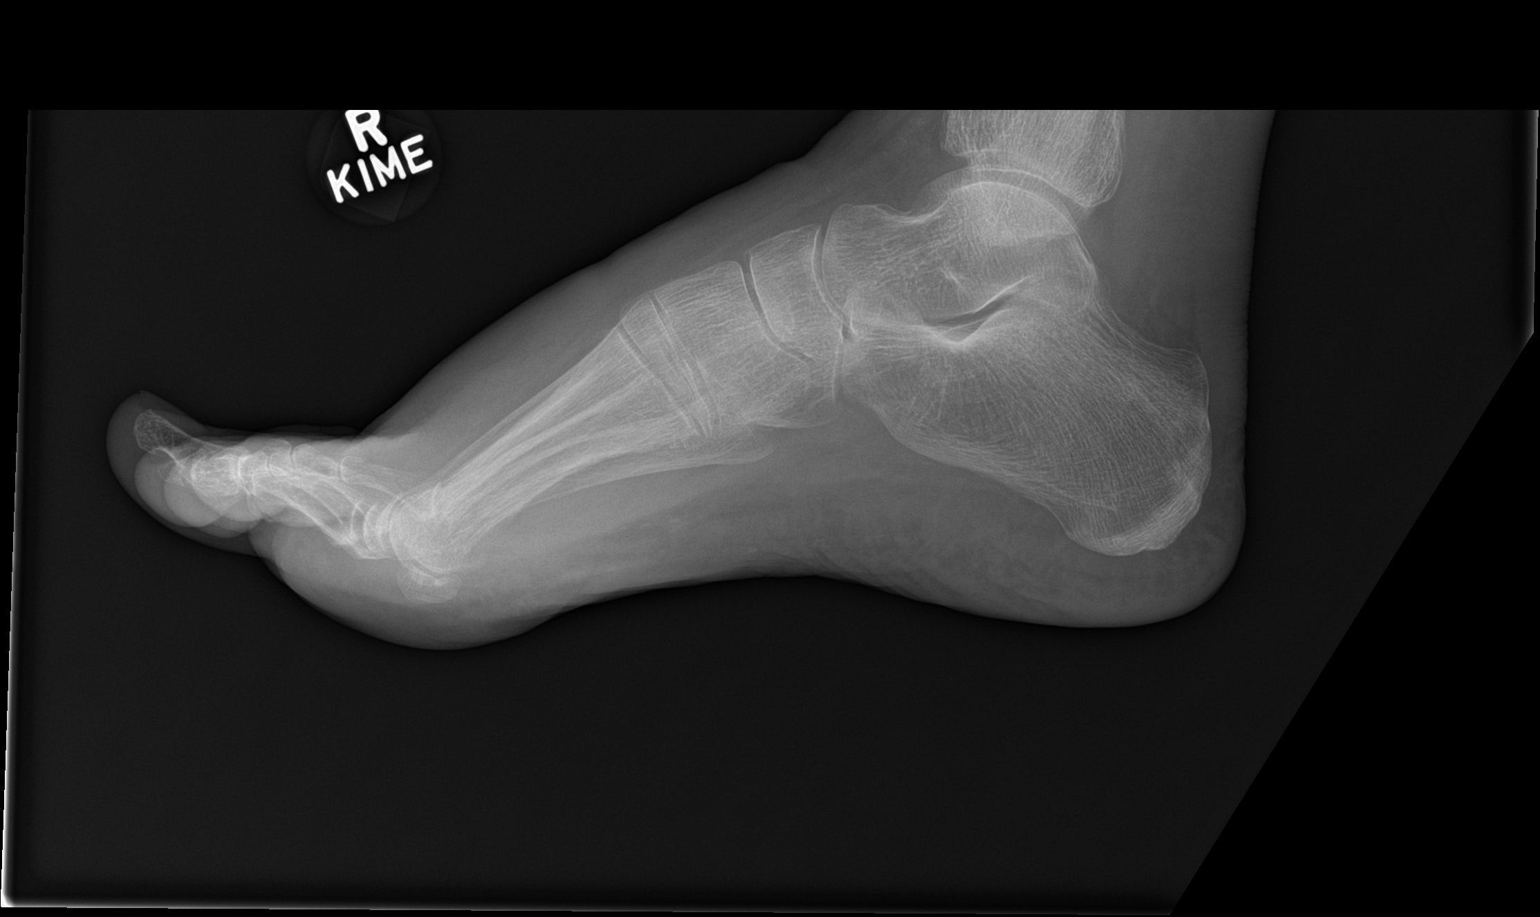

[3 of 3 positions shown; findings below may reference images not displayed]

FINDINGS: There is no evidence of fracture or dislocation. There is no
evidence of arthropathy or other focal bone abnormality. Soft
tissues are unremarkable.
IMPRESSION: Negative.
